# Patient Record
Sex: Female | Born: 1970 | Race: Black or African American | Hispanic: No | Marital: Married | Smoking: Never smoker
Health system: Southern US, Community
[De-identification: ages and names within clinical notes are randomized; demographics above are authoritative.]

## PROBLEM LIST (undated history)

## (undated) DIAGNOSIS — K219 Gastro-esophageal reflux disease without esophagitis: Secondary | ICD-10-CM

## (undated) DIAGNOSIS — D509 Iron deficiency anemia, unspecified: Secondary | ICD-10-CM

## (undated) DIAGNOSIS — E119 Type 2 diabetes mellitus without complications: Secondary | ICD-10-CM

## (undated) DIAGNOSIS — R053 Chronic cough: Secondary | ICD-10-CM

## (undated) DIAGNOSIS — R09A2 Foreign body sensation, throat: Secondary | ICD-10-CM

## (undated) DIAGNOSIS — R0989 Other specified symptoms and signs involving the circulatory and respiratory systems: Secondary | ICD-10-CM

## (undated) HISTORY — PX: HERNIA REPAIR: SHX51

---

## 2012-06-07 ENCOUNTER — Emergency Department (HOSPITAL_BASED_OUTPATIENT_CLINIC_OR_DEPARTMENT_OTHER)
Admission: EM | Admit: 2012-06-07 | Discharge: 2012-06-07 | Discharge status: Against Medical Advice | Payer: Self-pay | Attending: Emergency Medicine | Admitting: Emergency Medicine

## 2012-06-07 ENCOUNTER — Encounter (HOSPITAL_BASED_OUTPATIENT_CLINIC_OR_DEPARTMENT_OTHER): Payer: Self-pay | Admitting: *Deleted

## 2012-06-07 DIAGNOSIS — J029 Acute pharyngitis, unspecified: Secondary | ICD-10-CM | POA: Insufficient documentation

## 2012-06-07 DIAGNOSIS — Z5321 Procedure and treatment not carried out due to patient leaving prior to being seen by health care provider: Secondary | ICD-10-CM

## 2012-06-07 NOTE — ED Notes (Signed)
Geiple, PA to assess pt but pt not on stretcher.

## 2012-06-07 NOTE — ED Notes (Signed)
Pt never returned

## 2012-06-07 NOTE — ED Notes (Signed)
Sore throat x 3 days

## 2017-07-28 ENCOUNTER — Emergency Department (HOSPITAL_BASED_OUTPATIENT_CLINIC_OR_DEPARTMENT_OTHER)
Admission: EM | Admit: 2017-07-28 | Discharge: 2017-07-29 | Disposition: A | Discharge status: Home / Self Care | Payer: BLUE CROSS/BLUE SHIELD | Attending: Emergency Medicine | Admitting: Emergency Medicine

## 2017-07-28 ENCOUNTER — Other Ambulatory Visit: Payer: Self-pay

## 2017-07-28 ENCOUNTER — Emergency Department (HOSPITAL_BASED_OUTPATIENT_CLINIC_OR_DEPARTMENT_OTHER): Payer: BLUE CROSS/BLUE SHIELD

## 2017-07-28 ENCOUNTER — Encounter (HOSPITAL_BASED_OUTPATIENT_CLINIC_OR_DEPARTMENT_OTHER): Payer: Self-pay | Admitting: *Deleted

## 2017-07-28 DIAGNOSIS — I10 Essential (primary) hypertension: Secondary | ICD-10-CM

## 2017-07-28 DIAGNOSIS — R51 Headache: Secondary | ICD-10-CM | POA: Diagnosis present

## 2017-07-28 DIAGNOSIS — Z79899 Other long term (current) drug therapy: Secondary | ICD-10-CM | POA: Insufficient documentation

## 2017-07-28 DIAGNOSIS — G44209 Tension-type headache, unspecified, not intractable: Secondary | ICD-10-CM | POA: Diagnosis not present

## 2017-07-28 MED ORDER — IBUPROFEN 800 MG PO TABS
800.0000 mg | ORAL_TABLET | Freq: Once | ORAL | Status: AC
  Administered 2017-07-28: 800 mg via ORAL
  Filled 2017-07-28: qty 1

## 2017-07-28 MED ORDER — BUTALBITAL-APAP-CAFFEINE 50-325-40 MG PO TABS
1.0000 | ORAL_TABLET | Freq: Once | ORAL | Status: DC
  Filled 2017-07-28: qty 1

## 2017-07-28 MED ORDER — ACETAMINOPHEN 500 MG PO TABS
1000.0000 mg | ORAL_TABLET | Freq: Once | ORAL | Status: AC
  Administered 2017-07-28: 1000 mg via ORAL
  Filled 2017-07-28: qty 2

## 2017-07-28 NOTE — ED Notes (Signed)
Pt given coke for caffeine.

## 2017-07-28 NOTE — ED Notes (Signed)
ED Provider at bedside. 

## 2017-07-28 NOTE — ED Provider Notes (Signed)
[facility] EMERGENCY DEPARTMENT Provider Note   CSN: [id number] Arrival date & time: 07/28/17  1800     History   Chief Complaint Chief Complaint  Patient presents with  . Headache    HPI Kathryn Petersen is a 47 y.o. female.  47 yo F with a chief complaint of a headache.  This been going on for the past couple months.  Usually they are worse at the end of the day.  Pain is at the vertex of her head.  Worse with bright lights.  Nothing else seems to make it better or worse.  Describes it as a throbbing pain.  Denies radiation.  Denies fevers denies neck pain denies trauma.  Patient denies history of headaches prior to this.  Has been taking Advil with significant improvement of her symptoms.  She went to the OB/GYN today and was noted to have a high blood pressure and was sent emergently to the emergency department for a imaging study of her head.  She denies chest pain or shortness of breath.  Denies unilateral numbness or weakness denies difficulty swallowing or difficulty with speech.   The history is provided by the patient.  Headache   This is a new problem. The current episode started more than 1 week ago. The problem occurs constantly. The problem has not changed since onset.The headache is associated with bright light. Pain location: Vertex. The quality of the pain is described as dull and throbbing. The pain is at a severity of 8/10. The pain is moderate. The pain does not radiate. Pertinent negatives include no fever, no palpitations, no shortness of breath, no nausea and no vomiting. She has tried NSAIDs for the symptoms. The treatment provided moderate relief.    History reviewed. No pertinent past medical history.  There are no active problems to display for this patient.   History reviewed. No pertinent surgical history.  OB History    No data available       Home Medications    Prior to Admission medications   Medication Sig Start Date End Date Taking?  Authorizing Provider  acetaminophen (TYLENOL) 325 MG tablet Take 650 mg by mouth every 6 (six) hours as needed.    [provider]  hydrochlorothiazide (HYDRODIURIL) 25 MG tablet Take 1 tablet (25 mg total) by mouth daily. 07/29/17   Melene PlanFloyd, Anaiya Wisinski, DO    Family History No family history on file.  Social History Social History   Tobacco Use  . Smoking status: Never Smoker  . Smokeless tobacco: Never Used  Substance Use Topics  . Alcohol use: Yes    Alcohol/week: 0.0 oz  . Drug use: No     Allergies   Patient has no known allergies.   Review of Systems Review of Systems  Constitutional: Negative for chills and fever.  HENT: Negative for congestion and rhinorrhea.   Eyes: Negative for redness and visual disturbance.  Respiratory: Negative for shortness of breath and wheezing.   Cardiovascular: Negative for chest pain and palpitations.  Gastrointestinal: Negative for nausea and vomiting.  Genitourinary: Negative for dysuria and urgency.  Musculoskeletal: Negative for arthralgias and myalgias.  Skin: Negative for pallor and wound.  Neurological: Positive for headaches. Negative for dizziness.     Physical Exam Updated Vital Signs BP (!) 165/95   Pulse 82   Temp 98.3 F (36.8 C) (Oral)   Resp 20   Ht 5\' 5"  (1.651 m)   Wt 105.2 kg (232 lb)   LMP 06/23/2017  SpO2 99%   BMI 38.61 kg/m   Physical Exam  Constitutional: She is oriented to person, place, and time. She appears well-developed and well-nourished. No distress.  HENT:  Head: Normocephalic and atraumatic.  Eyes: EOM are normal. Pupils are equal, round, and reactive to light.  Neck: Normal range of motion. Neck supple.  Cardiovascular: Normal rate and regular rhythm. Exam reveals no gallop and no friction rub.  No murmur heard. Pulmonary/Chest: Effort normal. She has no wheezes. She has no rales.  Abdominal: Soft. She exhibits no distension. There is no tenderness.  Musculoskeletal: She exhibits no  edema or tenderness.  Neurological: She is alert and oriented to person, place, and time. She has normal strength. No cranial nerve deficit or sensory deficit. She displays a negative Romberg sign. Coordination and gait normal.  Reflex Scores:      Tricep reflexes are 2+ on the right side and 2+ on the left side.      Bicep reflexes are 2+ on the right side and 2+ on the left side.      Brachioradialis reflexes are 2+ on the right side and 2+ on the left side.      Patellar reflexes are 2+ on the right side and 2+ on the left side.      Achilles reflexes are 2+ on the right side and 2+ on the left side. Skin: Skin is warm and dry. She is not diaphoretic.  Psychiatric: She has a normal mood and affect. Her behavior is normal.  Nursing note and vitals reviewed.    ED Treatments / Results  Labs (all labs ordered are listed, but only abnormal results are displayed) Labs Reviewed - No data to display  EKG  EKG Interpretation None       Radiology Ct Head Wo Contrast  Result Date: 07/29/2017 CLINICAL DATA:  Subacute onset of headache. EXAM: CT HEAD WITHOUT CONTRAST TECHNIQUE: Contiguous axial images were obtained from the base of the skull through the vertex without intravenous contrast. COMPARISON:  None. FINDINGS: Brain: No evidence of acute infarction, hemorrhage, hydrocephalus, extra-axial collection or mass lesion/mass effect. The posterior fossa, including the cerebellum, brainstem and fourth ventricle, is within normal limits. The third and lateral ventricles, and basal ganglia are unremarkable in appearance. The cerebral hemispheres are symmetric in appearance, with normal gray-white differentiation. No mass effect or midline shift is seen. Vascular: No hyperdense vessel or unexpected calcification. Skull: There is no evidence of fracture; visualized osseous structures are unremarkable in appearance. Sinuses/Orbits: The visualized portions of the orbits are within normal limits. The  paranasal sinuses and mastoid air cells are well-aerated. Other: No significant soft tissue abnormalities are seen. IMPRESSION: Unremarkable noncontrast CT of the head. Electronically Signed   By: Roanna Raider M.D.   On: 07/29/2017 00:21    Procedures Procedures (including critical care time)  Medications Ordered in ED Medications  butalbital-acetaminophen-caffeine (FIORICET, ESGIC) 50-325-40 MG per tablet 1 tablet (1 tablet Oral Not Given 07/28/17 2323)  ibuprofen (ADVIL,MOTRIN) tablet 800 mg (800 mg Oral Given 07/28/17 2323)  acetaminophen (TYLENOL) tablet 1,000 mg (1,000 mg Oral Given 07/28/17 2337)     Initial Impression / Assessment and Plan / ED Course  I have reviewed the triage vital signs and the nursing notes.  Pertinent labs & imaging results that were available during my care of the patient were reviewed by me and considered in my medical decision making (see chart for details).     47 yo F with a chief complaint  of a headache.  Sounds like a tension headache by history.  She was sent from another clinic for a CT of the head.  I discussed the limitations of that study with the patient's but she would like one performed today.  CT negative.  Discharge home.  12:26 AM:  I have discussed the diagnosis/risks/treatment options with the patient and believe the pt to be eligible for discharge home to follow-up with PCP, Neuro. We also discussed returning to the ED immediately if new or worsening sx occur. We discussed the sx which are most concerning (e.g., sudden worsening pain, fever, inability to tolerate by mouth) that necessitate immediate return. Medications administered to the patient during their visit and any new prescriptions provided to the patient are listed below.  Medications given during this visit Medications  butalbital-acetaminophen-caffeine (FIORICET, ESGIC) 50-325-40 MG per tablet 1 tablet (1 tablet Oral Not Given 07/28/17 2323)  ibuprofen (ADVIL,MOTRIN) tablet  800 mg (800 mg Oral Given 07/28/17 2323)  acetaminophen (TYLENOL) tablet 1,000 mg (1,000 mg Oral Given 07/28/17 2337)     The patient appears reasonably screen and/or stabilized for discharge and I doubt any other medical condition or other [facility] requiring further screening, evaluation, or treatment in the ED at this time prior to discharge.    Final Clinical Impressions(s) / ED Diagnoses   Final diagnoses:  Tension headache  Essential hypertension    ED Discharge Orders        Ordered    Ambulatory referral to Neurology    Comments:  New headache syndrome   07/29/17 0024    hydrochlorothiazide (HYDRODIURIL) 25 MG tablet  Daily     07/29/17 0026       Melene Plan, DO 07/29/17 [id number]

## 2017-07-28 NOTE — ED Triage Notes (Signed)
Headache for a week. She went for her routine GYN exam and was told her BP is elevated.

## 2017-07-29 MED ORDER — HYDROCHLOROTHIAZIDE 25 MG PO TABS: 25.0000 mg | ORAL_TABLET | Freq: Every day | ORAL | 0 refills | Status: AC

## 2017-07-29 NOTE — Discharge Instructions (Signed)
Take 4 over the counter ibuprofen tablets 3 times a day or 2 over-the-counter naproxen tablets twice a day for pain. Also take tylenol 1000mg (2 extra strength) four times a day.    Your image of your head was normal.  Please follow-up with neurology to further evaluate your new headaches. Please follow up with a primary care provider to be evaluated and treated for your high blood pressure.

## 2020-07-04 DIAGNOSIS — L700 Acne vulgaris: Secondary | ICD-10-CM | POA: Diagnosis not present

## 2020-12-22 DIAGNOSIS — D509 Iron deficiency anemia, unspecified: Secondary | ICD-10-CM | POA: Diagnosis not present

## 2020-12-22 DIAGNOSIS — M722 Plantar fascial fibromatosis: Secondary | ICD-10-CM | POA: Diagnosis not present

## 2020-12-22 DIAGNOSIS — R053 Chronic cough: Secondary | ICD-10-CM | POA: Diagnosis not present

## 2021-01-03 DIAGNOSIS — Z1231 Encounter for screening mammogram for malignant neoplasm of breast: Secondary | ICD-10-CM | POA: Diagnosis not present

## 2021-03-15 DIAGNOSIS — Z0001 Encounter for general adult medical examination with abnormal findings: Secondary | ICD-10-CM | POA: Diagnosis not present

## 2021-03-15 DIAGNOSIS — Z23 Encounter for immunization: Secondary | ICD-10-CM | POA: Diagnosis not present

## 2021-03-15 DIAGNOSIS — D509 Iron deficiency anemia, unspecified: Secondary | ICD-10-CM | POA: Diagnosis not present

## 2021-03-15 DIAGNOSIS — K5909 Other constipation: Secondary | ICD-10-CM | POA: Diagnosis not present

## 2021-03-15 DIAGNOSIS — Z13220 Encounter for screening for lipoid disorders: Secondary | ICD-10-CM | POA: Diagnosis not present

## 2021-03-15 DIAGNOSIS — Z131 Encounter for screening for diabetes mellitus: Secondary | ICD-10-CM | POA: Diagnosis not present

## 2021-03-15 DIAGNOSIS — Z0000 Encounter for general adult medical examination without abnormal findings: Secondary | ICD-10-CM | POA: Diagnosis not present

## 2021-03-15 DIAGNOSIS — Z1329 Encounter for screening for other suspected endocrine disorder: Secondary | ICD-10-CM | POA: Diagnosis not present

## 2021-03-15 DIAGNOSIS — R053 Chronic cough: Secondary | ICD-10-CM | POA: Diagnosis not present

## 2021-03-30 DIAGNOSIS — Z1211 Encounter for screening for malignant neoplasm of colon: Secondary | ICD-10-CM | POA: Diagnosis not present

## 2021-03-30 DIAGNOSIS — K219 Gastro-esophageal reflux disease without esophagitis: Secondary | ICD-10-CM | POA: Diagnosis not present

## 2021-03-30 DIAGNOSIS — R1310 Dysphagia, unspecified: Secondary | ICD-10-CM | POA: Diagnosis not present

## 2021-05-01 DIAGNOSIS — Z1211 Encounter for screening for malignant neoplasm of colon: Secondary | ICD-10-CM | POA: Diagnosis not present

## 2021-05-01 DIAGNOSIS — R1310 Dysphagia, unspecified: Secondary | ICD-10-CM | POA: Diagnosis not present

## 2021-05-01 DIAGNOSIS — K648 Other hemorrhoids: Secondary | ICD-10-CM | POA: Diagnosis not present

## 2021-05-01 DIAGNOSIS — K219 Gastro-esophageal reflux disease without esophagitis: Secondary | ICD-10-CM | POA: Diagnosis not present

## 2021-05-01 DIAGNOSIS — R0989 Other specified symptoms and signs involving the circulatory and respiratory systems: Secondary | ICD-10-CM | POA: Diagnosis not present

## 2021-05-26 ENCOUNTER — Other Ambulatory Visit: Payer: Self-pay

## 2021-05-26 ENCOUNTER — Emergency Department (HOSPITAL_BASED_OUTPATIENT_CLINIC_OR_DEPARTMENT_OTHER): Payer: BC Managed Care – PPO

## 2021-05-26 ENCOUNTER — Emergency Department (HOSPITAL_BASED_OUTPATIENT_CLINIC_OR_DEPARTMENT_OTHER)
Admission: EM | Admit: 2021-05-26 | Discharge: 2021-05-26 | Disposition: A | Discharge status: Home / Self Care | Payer: BC Managed Care – PPO | Attending: Emergency Medicine | Admitting: Emergency Medicine

## 2021-05-26 ENCOUNTER — Encounter (HOSPITAL_BASED_OUTPATIENT_CLINIC_OR_DEPARTMENT_OTHER): Payer: Self-pay | Admitting: Emergency Medicine

## 2021-05-26 DIAGNOSIS — J209 Acute bronchitis, unspecified: Secondary | ICD-10-CM

## 2021-05-26 DIAGNOSIS — R0602 Shortness of breath: Secondary | ICD-10-CM | POA: Diagnosis not present

## 2021-05-26 DIAGNOSIS — Z20822 Contact with and (suspected) exposure to covid-19: Secondary | ICD-10-CM | POA: Diagnosis not present

## 2021-05-26 DIAGNOSIS — J9801 Acute bronchospasm: Secondary | ICD-10-CM | POA: Diagnosis not present

## 2021-05-26 HISTORY — DX: Other specified symptoms and signs involving the circulatory and respiratory systems: R09.89

## 2021-05-26 HISTORY — DX: Chronic cough: R05.3

## 2021-05-26 HISTORY — DX: Gastro-esophageal reflux disease without esophagitis: K21.9

## 2021-05-26 HISTORY — DX: Iron deficiency anemia, unspecified: D50.9

## 2021-05-26 HISTORY — DX: Foreign body sensation, throat: R09.A2

## 2021-05-26 LAB — RESP PANEL BY RT-PCR (FLU A&B, COVID) ARPGX2
Influenza A by PCR: NEGATIVE
Influenza B by PCR: NEGATIVE
SARS Coronavirus 2 by RT PCR: NEGATIVE

## 2021-05-26 MED ORDER — DEXAMETHASONE SODIUM PHOSPHATE 10 MG/ML IJ SOLN
10.0000 mg | Freq: Once | INTRAMUSCULAR | Status: AC
  Administered 2021-05-26: 10 mg via INTRAMUSCULAR
  Filled 2021-05-26: qty 1

## 2021-05-26 MED ORDER — ALBUTEROL SULFATE HFA 108 (90 BASE) MCG/ACT IN AERS
INHALATION_SPRAY | RESPIRATORY_TRACT | Status: AC
  Filled 2021-05-26: qty 6.7

## 2021-05-26 MED ORDER — IPRATROPIUM BROMIDE HFA 17 MCG/ACT IN AERS
2.0000 | INHALATION_SPRAY | Freq: Once | RESPIRATORY_TRACT | Status: AC
  Administered 2021-05-26: 2 via RESPIRATORY_TRACT
  Filled 2021-05-26: qty 12.9

## 2021-05-26 MED ORDER — ALBUTEROL SULFATE HFA 108 (90 BASE) MCG/ACT IN AERS
8.0000 | INHALATION_SPRAY | Freq: Once | RESPIRATORY_TRACT | Status: AC
Start: 2021-05-26 — End: 2021-05-26
  Administered 2021-05-26: 8 via RESPIRATORY_TRACT

## 2021-05-26 MED ORDER — IPRATROPIUM-ALBUTEROL 0.5-2.5 (3) MG/3ML IN SOLN
3.0000 mL | RESPIRATORY_TRACT | Status: DC
  Administered 2021-05-26: 3 mL via RESPIRATORY_TRACT
  Filled 2021-05-26: qty 3

## 2021-05-26 MED ORDER — ALBUTEROL SULFATE (2.5 MG/3ML) 0.083% IN NEBU
2.5000 mg | INHALATION_SOLUTION | Freq: Once | RESPIRATORY_TRACT | Status: AC
Start: 2021-05-26 — End: 2021-05-26
  Administered 2021-05-26: 2.5 mg via RESPIRATORY_TRACT
  Filled 2021-05-26: qty 3

## 2021-05-26 NOTE — ED Provider Notes (Signed)
[facility] Provider Note: Lowella Dell, MD, FACEP  CSN: [id number] MRN: [id number] ARRIVAL: 05/26/21 at 0135 ROOM: [facility]12/[facility]12   CHIEF COMPLAINT  Shortness of Breath   HISTORY OF PRESENT ILLNESS  05/26/21 4:04 AM Kathryn Petersen is a 51 y.o. female with shortness of breath since about 3 AM yesterday morning.  She has had associated wheezing and cough.  She has no history of asthma.  She is not febrile.  She was given multiple albuterol and ipratropium inhaler treatments prior to my evaluation with improvement in her dyspnea but wheezing continues.  She states she has chronic lower extremity edema.  She denies vomiting or diarrhea.   Past Medical History:  Diagnosis Date   Chronic cough    GERD (gastroesophageal reflux disease)    Globus sensation    Iron deficiency anemia     History reviewed. No pertinent surgical history.  History reviewed. No pertinent family history.  Social History   Tobacco Use   Smoking status: Never   Smokeless tobacco: Never  Substance Use Topics   Alcohol use: Yes    Alcohol/week: 0.0 standard drinks   Drug use: No    Prior to Admission medications   Medication Sig Start Date End Date Taking? Authorizing Provider  Benzoyl Peroxide 5.3 % FOAM Apply topically. 02/11/20  Yes [provider]  famotidine (PEPCID) 40 MG tablet TAKE 1 TABLET BY MOUTH EVERY DAY AT NIGHT 08/27/19  Yes [provider]  ferrous sulfate 325 (65 FE) MG EC tablet Take by mouth. 12/25/20  Yes [provider]  fluticasone (FLONASE) 50 MCG/ACT nasal spray Place into the nose. 09/30/17  Yes [provider]  gabapentin (NEURONTIN) 300 MG capsule Take by mouth. 08/02/19  Yes [provider]  acetaminophen (TYLENOL) 325 MG tablet Take 650 mg by mouth every 6 (six) hours as needed.    [provider]  albuterol (VENTOLIN HFA) 108 (90 Base) MCG/ACT inhaler Inhale 2 puffs into the lungs every 4 (four) hours. 03/15/21    [provider]  cetirizine (ZYRTEC) 10 MG tablet Take 10 mg by mouth daily. 03/15/21   [provider]  hydrochlorothiazide (HYDRODIURIL) 25 MG tablet Take 1 tablet (25 mg total) by mouth daily. 07/29/17   Melene Plan, DO  meloxicam (MOBIC) 15 MG tablet Take 15 mg by mouth daily. 12/22/20   [provider]  pantoprazole (PROTONIX) 40 MG tablet Take 40 mg by mouth every morning. 03/15/21   [provider]    Allergies Patient has no known allergies.   REVIEW OF SYSTEMS  Negative except as noted here or in the History of Present Illness.   PHYSICAL EXAMINATION  Initial Vital Signs Blood pressure (!) 159/105, pulse (!) 103, temperature 97.9 F (36.6 C), temperature source Oral, resp. rate (!) 28, height 5\' 5"  (1.651 m), weight 105.2 kg, SpO2 94 %.  Examination General: Well-developed, well-nourished female in no acute distress; appearance consistent with age of record HENT: normocephalic; atraumatic Eyes: Normal appearance Neck: supple Heart: regular rate and rhythm Lungs: clear to auscultation bilaterally Abdomen: soft; nondistended; nontender; bowel sounds present Extremities: No deformity; full range of motion; edema of lower legs Neurologic: Awake, alert and oriented; motor function intact in all extremities and symmetric; no facial droop Skin: Warm and dry Psychiatric: Normal mood and affect   RESULTS  Summary of this visit's results, reviewed and interpreted by myself:   EKG Interpretation  Date/Time:    Ventricular Rate:    PR Interval:  QRS Duration:   QT Interval:    QTC Calculation:   R Axis:     Text Interpretation:         Laboratory Studies: Results for orders placed or performed during the hospital encounter of 05/26/21 (from the past 24 hour(s))  Resp Panel by RT-PCR (Flu A&B, Covid) Nasopharyngeal Swab     Status: None   Collection Time: 05/26/21  1:46 AM   Specimen: Nasopharyngeal Swab; Nasopharyngeal(NP) swabs in  vial transport medium  Result Value Ref Range   SARS Coronavirus 2 by RT PCR NEGATIVE NEGATIVE   Influenza A by PCR NEGATIVE NEGATIVE   Influenza B by PCR NEGATIVE NEGATIVE   Imaging Studies: DG Chest 2 View  Result Date: 05/26/2021 CLINICAL DATA:  Shortness of breath. EXAM: CHEST - 2 VIEW COMPARISON:  Chest radiograph dated 08/18/2017 FINDINGS: The heart size and mediastinal contours are within normal limits. Both lungs are clear. The visualized skeletal structures are unremarkable. IMPRESSION: No active cardiopulmonary disease. Electronically Signed   By: Elgie Collard M.D.   On: 05/26/2021 02:32    ED COURSE and MDM  Nursing notes, initial and subsequent vitals signs, including pulse oximetry, reviewed and interpreted by myself.  Vitals:   05/26/21 0230 05/26/21 0300 05/26/21 0419 05/26/21 0500  BP:  (!) 144/105  133/71  Pulse: 96   (!) 103  Resp:    20  Temp:      TempSrc:      SpO2: 94%  95% 94%  Weight:      Height:       Medications  albuterol (VENTOLIN HFA) 108 (90 Base) MCG/ACT inhaler (  Not Given 05/26/21 0211)  ipratropium-albuterol (DUONEB) 0.5-2.5 (3) MG/3ML nebulizer solution 3 mL (3 mLs Nebulization Given 05/26/21 0415)  albuterol (VENTOLIN HFA) 108 (90 Base) MCG/ACT inhaler 8 puff (8 puffs Inhalation Given 05/26/21 0153)  ipratropium (ATROVENT HFA) inhaler 2 puff (2 puffs Inhalation Given 05/26/21 0209)  albuterol (PROVENTIL) (2.5 MG/3ML) 0.083% nebulizer solution 2.5 mg (2.5 mg Nebulization Given 05/26/21 0415)  dexamethasone (DECADRON) injection 10 mg (10 mg Intramuscular Given 05/26/21 0411)   5:22 AM Lungs clear after additional neb treatment.  Patient clearly has acute bronchospasm.  She is negative for COVID and influenza but this could represent an acute viral illness.  It could also be reactive to an environmental stressor.  She was given a dose of dexamethasone and we will send her home with an inhaler and AeroChamber.   PROCEDURES  Procedures   ED DIAGNOSES      ICD-10-CM   1. Acute bronchitis with bronchospasm  J20.9          Aidenn Skellenger, Jonny Ruiz, MD 05/26/21 [phone]

## 2021-05-26 NOTE — Progress Notes (Signed)
Patient states she is feeling better after MDI treatments.

## 2021-05-26 NOTE — ED Triage Notes (Addendum)
Pt c/o shob since 3 am. Pt also reports cough. Pt has audible wheezing.

## 2021-05-26 NOTE — ED Notes (Signed)
Patient transported to X-ray 

## 2021-06-02 ENCOUNTER — Other Ambulatory Visit: Payer: Self-pay

## 2021-06-02 ENCOUNTER — Emergency Department (HOSPITAL_BASED_OUTPATIENT_CLINIC_OR_DEPARTMENT_OTHER): Payer: BC Managed Care – PPO

## 2021-06-02 ENCOUNTER — Encounter (HOSPITAL_BASED_OUTPATIENT_CLINIC_OR_DEPARTMENT_OTHER): Payer: Self-pay | Admitting: Emergency Medicine

## 2021-06-02 ENCOUNTER — Emergency Department (HOSPITAL_BASED_OUTPATIENT_CLINIC_OR_DEPARTMENT_OTHER)
Admission: EM | Admit: 2021-06-02 | Discharge: 2021-06-02 | Disposition: A | Discharge status: Home / Self Care | Payer: BC Managed Care – PPO | Attending: Emergency Medicine | Admitting: Emergency Medicine

## 2021-06-02 DIAGNOSIS — Z7951 Long term (current) use of inhaled steroids: Secondary | ICD-10-CM | POA: Diagnosis not present

## 2021-06-02 DIAGNOSIS — J209 Acute bronchitis, unspecified: Secondary | ICD-10-CM | POA: Insufficient documentation

## 2021-06-02 DIAGNOSIS — J40 Bronchitis, not specified as acute or chronic: Secondary | ICD-10-CM

## 2021-06-02 DIAGNOSIS — M7989 Other specified soft tissue disorders: Secondary | ICD-10-CM | POA: Insufficient documentation

## 2021-06-02 DIAGNOSIS — J4541 Moderate persistent asthma with (acute) exacerbation: Secondary | ICD-10-CM | POA: Diagnosis not present

## 2021-06-02 DIAGNOSIS — R0602 Shortness of breath: Secondary | ICD-10-CM | POA: Diagnosis not present

## 2021-06-02 DIAGNOSIS — R7989 Other specified abnormal findings of blood chemistry: Secondary | ICD-10-CM | POA: Diagnosis not present

## 2021-06-02 DIAGNOSIS — M549 Dorsalgia, unspecified: Secondary | ICD-10-CM | POA: Diagnosis not present

## 2021-06-02 DIAGNOSIS — Z0389 Encounter for observation for other suspected diseases and conditions ruled out: Secondary | ICD-10-CM | POA: Diagnosis not present

## 2021-06-02 HISTORY — DX: Type 2 diabetes mellitus without complications: E11.9

## 2021-06-02 LAB — CBC WITH DIFFERENTIAL/PLATELET
Abs Immature Granulocytes: 0.01 10*3/uL (ref 0.00–0.07)
Basophils Absolute: 0 10*3/uL (ref 0.0–0.1)
Basophils Relative: 1 %
Eosinophils Absolute: 0.8 10*3/uL — ABNORMAL HIGH (ref 0.0–0.5)
Eosinophils Relative: 14 %
HCT: 37.1 % (ref 36.0–46.0)
Hemoglobin: 11.4 g/dL — ABNORMAL LOW (ref 12.0–15.0)
Immature Granulocytes: 0 %
Lymphocytes Relative: 27 %
Lymphs Abs: 1.6 10*3/uL (ref 0.7–4.0)
MCH: 22.2 pg — ABNORMAL LOW (ref 26.0–34.0)
MCHC: 30.7 g/dL (ref 30.0–36.0)
MCV: 72.3 fL — ABNORMAL LOW (ref 80.0–100.0)
Monocytes Absolute: 0.4 10*3/uL (ref 0.1–1.0)
Monocytes Relative: 7 %
Neutro Abs: 3.1 10*3/uL (ref 1.7–7.7)
Neutrophils Relative %: 51 %
Platelets: 271 10*3/uL (ref 150–400)
RBC: 5.13 MIL/uL — ABNORMAL HIGH (ref 3.87–5.11)
RDW: 15.2 % (ref 11.5–15.5)
WBC: 6 10*3/uL (ref 4.0–10.5)
nRBC: 0 % (ref 0.0–0.2)

## 2021-06-02 LAB — BASIC METABOLIC PANEL
Anion gap: 7 (ref 5–15)
BUN: 9 mg/dL (ref 6–20)
CO2: 28 mmol/L (ref 22–32)
Calcium: 8.7 mg/dL — ABNORMAL LOW (ref 8.9–10.3)
Chloride: 102 mmol/L (ref 98–111)
Creatinine, Ser: 0.76 mg/dL (ref 0.44–1.00)
GFR, Estimated: >60 mL/min (ref >60)
Glucose, Bld: 113 mg/dL — ABNORMAL HIGH (ref 70–99)
Potassium: 3.7 mmol/L (ref 3.5–5.1)
Sodium: 137 mmol/L (ref 135–145)

## 2021-06-02 LAB — D-DIMER, QUANTITATIVE: D-Dimer, Quant: 0.69 ug/mL-FEU — ABNORMAL HIGH (ref 0.00–0.50)

## 2021-06-02 MED ORDER — SODIUM CHLORIDE 0.9 % IV BOLUS
500.0000 mL | Freq: Once | INTRAVENOUS | Status: AC
  Administered 2021-06-02: 500 mL via INTRAVENOUS

## 2021-06-02 MED ORDER — RACEPINEPHRINE HCL 2.25 % IN NEBU
0.5000 mL | INHALATION_SOLUTION | Freq: Once | RESPIRATORY_TRACT | Status: AC
  Administered 2021-06-02: 0.5 mL via RESPIRATORY_TRACT
  Filled 2021-06-02: qty 0.5

## 2021-06-02 MED ORDER — IOHEXOL 300 MG/ML  SOLN
60.0000 mL | Freq: Once | INTRAMUSCULAR | Status: AC | PRN
  Administered 2021-06-02: 60 mL via INTRAVENOUS

## 2021-06-02 MED ORDER — ALBUTEROL SULFATE (2.5 MG/3ML) 0.083% IN NEBU
5.0000 mg | INHALATION_SOLUTION | Freq: Once | RESPIRATORY_TRACT | Status: AC
  Administered 2021-06-02: 5 mg via RESPIRATORY_TRACT
  Filled 2021-06-02: qty 6

## 2021-06-02 MED ORDER — IPRATROPIUM-ALBUTEROL 0.5-2.5 (3) MG/3ML IN SOLN
3.0000 mL | Freq: Once | RESPIRATORY_TRACT | Status: AC
  Administered 2021-06-02: 3 mL via RESPIRATORY_TRACT
  Filled 2021-06-02: qty 3

## 2021-06-02 MED ORDER — IBUPROFEN 400 MG PO TABS
400.0000 mg | ORAL_TABLET | Freq: Once | ORAL | Status: AC
  Administered 2021-06-02: 400 mg via ORAL
  Filled 2021-06-02: qty 1

## 2021-06-02 MED ORDER — DIPHENHYDRAMINE HCL 50 MG/ML IJ SOLN
50.0000 mg | Freq: Once | INTRAMUSCULAR | Status: AC
  Administered 2021-06-02: 50 mg via INTRAVENOUS
  Filled 2021-06-02: qty 1

## 2021-06-02 MED ORDER — DEXAMETHASONE SODIUM PHOSPHATE 10 MG/ML IJ SOLN
10.0000 mg | Freq: Once | INTRAMUSCULAR | Status: AC
  Administered 2021-06-02: 10 mg via INTRAMUSCULAR
  Filled 2021-06-02: qty 1

## 2021-06-02 MED ORDER — IOHEXOL 350 MG/ML SOLN
100.0000 mL | Freq: Once | INTRAVENOUS | Status: AC | PRN
  Administered 2021-06-02: 100 mL via INTRAVENOUS

## 2021-06-02 NOTE — ED Notes (Signed)
RT at bedside.

## 2021-06-02 NOTE — ED Triage Notes (Signed)
Pt is c/o shortness of breath  Pt was seen on the 7th for same  Pt states she has been using her inhaler at home without relief

## 2021-06-02 NOTE — ED Notes (Addendum)
Patient returned from CT

## 2021-06-02 NOTE — ED Notes (Signed)
Patient transported to US 

## 2021-06-02 NOTE — ED Notes (Signed)
RT at bedside for breathing tx.

## 2021-06-02 NOTE — ED Notes (Signed)
Pt returned from [place], c/o of shob, using accessory muscles to breath. RT notified.

## 2021-06-02 NOTE — ED Notes (Signed)
IV access attempted x 1, unsuccessful. RT at bedside, will attempt.

## 2021-06-02 NOTE — Discharge Instructions (Signed)
Follow-up with your primary care doctor on Thursday as scheduled.  Use the albuterol inhaler every 4-6 hours for the next 3 to 4 days and then just use it as needed after that.  Return to emergency room if you have any worsening symptoms.

## 2021-06-02 NOTE — ED Provider Notes (Signed)
[facility] EMERGENCY DEPARTMENT Provider Note   CSN: [id number] Arrival date & time: 06/02/21  [id number]     History  Chief Complaint  Patient presents with   Shortness of Breath    Kathryn Petersen is a 51 y.o. female.  Patient is a 51 year old female who presents with cough wheezing and shortness of breath.  She has had about a 1 week history of cough and congestion.  She reports a cough that is productive of yellow sputum.  Has a bifrontal headache.  She feels febrile at times.  She has some myalgias.  She also has had some wheezing.  No known history of asthma or COPD.  She was seen here on January 7 for similar symptoms.  She was given dose of Decadron and inhalers.  She was discharged with inhalers.  She has been using them but her symptoms got worse since yesterday.  She has some pain in her back which she says is mostly from coughing.  She does not report any discomfort when she is not coughing.  She does notice that her left leg has started swelling as compared to her right.  She says this is different than her baseline swelling.  She is not a smoker.      Home Medications Prior to Admission medications   Medication Sig Start Date End Date Taking? Authorizing Provider  acetaminophen (TYLENOL) 325 MG tablet Take 650 mg by mouth every 6 (six) hours as needed.    [provider]  albuterol (VENTOLIN HFA) 108 (90 Base) MCG/ACT inhaler Inhale 2 puffs into the lungs every 4 (four) hours. 03/15/21   [provider]  Benzoyl Peroxide 5.3 % FOAM Apply topically. 02/11/20   [provider]  cetirizine (ZYRTEC) 10 MG tablet Take 10 mg by mouth daily. 03/15/21   [provider]  famotidine (PEPCID) 40 MG tablet TAKE 1 TABLET BY MOUTH EVERY DAY AT NIGHT 08/27/19   [provider]  ferrous sulfate 325 (65 FE) MG EC tablet Take by mouth. 12/25/20   [provider]  fluticasone (FLONASE) 50 MCG/ACT nasal spray Place into the nose. 09/30/17    [provider]  gabapentin (NEURONTIN) 300 MG capsule Take by mouth. 08/02/19   [provider]  hydrochlorothiazide (HYDRODIURIL) 25 MG tablet Take 1 tablet (25 mg total) by mouth daily. 07/29/17   Deno Etienne, DO  meloxicam (MOBIC) 15 MG tablet Take 15 mg by mouth daily. 12/22/20   [provider]  pantoprazole (PROTONIX) 40 MG tablet Take 40 mg by mouth every morning. 03/15/21   [provider]      Allergies    Patient has no known allergies.    Review of Systems   Review of Systems  Constitutional:  Positive for fatigue and fever. Negative for chills and diaphoresis.  HENT:  Negative for congestion, rhinorrhea and sneezing.   Eyes: Negative.   Respiratory:  Positive for cough, shortness of breath and wheezing. Negative for chest tightness.   Cardiovascular:  Negative for chest pain and leg swelling.  Gastrointestinal:  Negative for abdominal pain, blood in stool, diarrhea, nausea and vomiting.  Genitourinary:  Negative for difficulty urinating, flank pain, frequency and hematuria.  Musculoskeletal:  Positive for back pain. Negative for arthralgias.  Skin:  Negative for rash.  Neurological:  Positive for headaches. Negative for dizziness, speech difficulty, weakness and numbness.   Physical Exam Updated Vital Signs BP (!) 152/85    Pulse 96    Temp 98 F (  36.7 C) (Oral)    Resp 18    Ht 5\' 5"  (1.651 m)    Wt 106.8 kg    SpO2 96%    BMI 39.17 kg/m  Physical Exam Constitutional:      Appearance: She is well-developed.  HENT:     Head: Normocephalic and atraumatic.  Eyes:     Pupils: Pupils are equal, round, and reactive to light.  Cardiovascular:     Rate and Rhythm: Normal rate and regular rhythm.     Heart sounds: Normal heart sounds.  Pulmonary:     Effort: Pulmonary effort is normal. No respiratory distress.     Breath sounds: Wheezing present. No rales.     Comments: Mild expiratory wheezing in the upper lung fields bilaterally, mild  tachypnea, no increased work of breathing Chest:     Chest wall: No tenderness.  Abdominal:     General: Bowel sounds are normal.     Palpations: Abdomen is soft.     Tenderness: There is no abdominal tenderness. There is no guarding or rebound.  Musculoskeletal:        General: Normal range of motion.     Cervical back: Normal range of motion and neck supple.     Comments: Trace edema to lower extremities bilaterally, the left is slightly greater than the right.  Lymphadenopathy:     Cervical: No cervical adenopathy.  Skin:    General: Skin is warm and dry.     Findings: No rash.  Neurological:     Mental Status: She is alert and oriented to person, place, and time.    ED Results / Procedures / Treatments   Labs (all labs ordered are listed, but only abnormal results are displayed) Labs Reviewed  BASIC METABOLIC PANEL - Abnormal; Notable for the following components:      Result Value   Glucose, Bld 113 (*)    Calcium 8.7 (*)    All other components within normal limits  CBC WITH DIFFERENTIAL/PLATELET - Abnormal; Notable for the following components:   RBC 5.13 (*)    Hemoglobin 11.4 (*)    MCV 72.3 (*)    MCH 22.2 (*)    Eosinophils Absolute 0.8 (*)    All other components within normal limits  D-DIMER, QUANTITATIVE - Abnormal; Notable for the following components:   D-Dimer, Quant 0.69 (*)    All other components within normal limits    EKG None  Radiology DG Chest 2 View  Result Date: 06/02/2021 CLINICAL DATA:  Shortness of breath EXAM: CHEST - 2 VIEW COMPARISON:  05/26/2021 FINDINGS: Normal heart size and mediastinal contours. Central airway cuffing also seen on chest CT from 2021. No acute infiltrate or edema. No effusion or pneumothorax. No acute osseous findings. IMPRESSION: Airway thickening without collapse or consolidation. Electronically Signed   By: Jorje Guild M.D.   On: 06/02/2021 08:26   CT Soft Tissue Neck W Contrast  Result Date:  06/02/2021 CLINICAL DATA:  Epiglottitis or tonsillitis suspected EXAM: CT NECK WITH CONTRAST TECHNIQUE: Multidetector CT imaging of the neck was performed using the standard protocol following the bolus administration of intravenous contrast. RADIATION DOSE REDUCTION: This exam was performed according to the departmental dose-optimization program which includes automated exposure control, adjustment of the mA and/or kV according to patient size and/or use of iterative reconstruction technique. CONTRAST:  78mL OMNIPAQUE IOHEXOL 300 MG/ML  SOLN COMPARISON:  None. FINDINGS: Pharynx and larynx: Normal. No mass or swelling. Salivary glands: No inflammation, mass,  or stone. Thyroid: Normal. Lymph nodes: None enlarged or abnormal density. Vascular: Negative. Limited intracranial: Negative. Visualized orbits: Negative. Mastoids and visualized paranasal sinuses: Clear. Skeleton: No acute or aggressive process. Upper chest: Negative. IMPRESSION: No acute finding or airway narrowing. Electronically Signed   By: Jorje Guild M.D.   On: 06/02/2021 12:23   CT Angio Chest PE W/Cm &/Or Wo Cm  Result Date: 06/02/2021 CLINICAL DATA:  51 year old female with positive D-dimer. Clinical suspicion for pulmonary embolism. EXAM: CT ANGIOGRAPHY CHEST WITH CONTRAST TECHNIQUE: Multidetector CT imaging of the chest was performed using the standard protocol during bolus administration of intravenous contrast. Multiplanar CT image reconstructions and MIPs were obtained to evaluate the vascular anatomy. RADIATION DOSE REDUCTION: This exam was performed according to the departmental dose-optimization program which includes automated exposure control, adjustment of the mA and/or kV according to patient size and/or use of iterative reconstruction technique. CONTRAST:  139mL OMNIPAQUE IOHEXOL 350 MG/ML SOLN COMPARISON:  Contrast enhanced chest CT 08/10/2019. FINDINGS: Cardiovascular: There are no filling defects within the pulmonary arterial  tree to suggest pulmonary embolism. Heart size is normal. There is no significant pericardial fluid, thickening or pericardial calcification. No atherosclerotic calcifications are noted in the thoracic aorta or the coronary arteries. Mediastinum/Nodes: No pathologically enlarged mediastinal or hilar lymph nodes. Esophagus is unremarkable in appearance. No axillary lymphadenopathy. Lungs/Pleura: No acute consolidative airspace disease. No pleural effusions. No suspicious appearing pulmonary nodules or masses are noted. Diffuse bronchial wall thickening is identified. No peribronchovascular inflammatory changes. Upper Abdomen: Unremarkable. Musculoskeletal: There are no aggressive appearing lytic or blastic lesions noted in the visualized portions of the skeleton. Review of the MIP images confirms the above findings. IMPRESSION: 1. No evidence of pulmonary embolism. 2. Diffuse bronchial wall thickening, suggestive of severe reactive airway disease. Electronically Signed   By: Vinnie Langton M.D.   On: 06/02/2021 10:40   US Venous Img Lower Unilateral Left (DVT)  Result Date: 06/02/2021 CLINICAL DATA:  Leg swelling EXAM: LEFT LOWER EXTREMITY VENOUS DOPPLER ULTRASOUND TECHNIQUE: Gray-scale sonography with compression, as well as color and duplex ultrasound, were performed to evaluate the deep venous system(s) from the level of the common femoral vein through the popliteal and proximal calf veins. COMPARISON:  Chest XR, concurrent. FINDINGS: VENOUS Normal compressibility of the LEFT common femoral, superficial femoral, and popliteal veins, as well as the visualized calf veins. Visualized portions of profunda femoral vein and great saphenous vein unremarkable. No filling defects to suggest DVT on grayscale or color Doppler imaging. Doppler waveforms show normal direction of venous flow, normal respiratory plasticity and response to augmentation. Limited views of the contralateral common femoral vein are  unremarkable. OTHER No evidence of superficial thrombophlebitis or abnormal fluid collection. Limitations: none IMPRESSION: No evidence of femoropopliteal DVT within the LEFT lower extremity. Michaelle Birks, MD [facility] Radiology Electronically Signed   By: Michaelle Birks M.D.   On: 06/02/2021 09:40    Procedures Procedures    Medications Ordered in ED Medications  ipratropium-albuterol (DUONEB) 0.5-2.5 (3) MG/3ML nebulizer solution 3 mL (3 mLs Nebulization Given 06/02/21 0656)  albuterol (PROVENTIL) (2.5 MG/3ML) 0.083% nebulizer solution 5 mg (5 mg Nebulization Given 06/02/21 0750)  ibuprofen (ADVIL) tablet 400 mg (400 mg Oral Given 06/02/21 0742)  dexamethasone (DECADRON) injection 10 mg (10 mg Intramuscular Given 06/02/21 0934)  albuterol (PROVENTIL) (2.5 MG/3ML) 0.083% nebulizer solution 5 mg (5 mg Nebulization Given 06/02/21 0945)  iohexol (OMNIPAQUE) 350 MG/ML injection 100 mL (100 mLs Intravenous Contrast Given  06/02/21 1022)  sodium chloride 0.9 % bolus 500 mL (500 mLs Intravenous New Bag/Given 06/02/21 1119)  Racepinephrine HCl 2.25 % nebulizer solution 0.5 mL (0.5 mLs Nebulization Given 06/02/21 1132)  diphenhydrAMINE (BENADRYL) injection 50 mg (50 mg Intravenous Given 06/02/21 1141)  iohexol (OMNIPAQUE) 300 MG/ML solution 60 mL (60 mLs Intravenous Contrast Given 06/02/21 1208)    ED Course/ Medical Decision Making/ A&P                           Medical Decision Making Amount and/or Complexity of Data Reviewed External Data Reviewed: labs, radiology and notes. Labs: ordered. Decision-making details documented in ED Course. Radiology: ordered and independent interpretation performed. Decision-making details documented in ED Course.  Risk Drug therapy requiring intensive monitoring for toxicity. Decision regarding hospitalization.  Critical Care Total time providing critical care: 30-74 minutes  Patient is a 51 year old female who  presents with wheezing and shortness of breath.  She initially received albuterol nebulizer treatments with some improvement in symptoms but not significantly.  She was also given IM Decadron.  She had a chest x-ray which showed no acute abnormality.  This was reviewed by me.  No evidence of pneumonia.  No chest pain or symptoms that sound more like ACS.  D-dimer was checked and was positive.  She had a CTA of her chest which showed no evidence of PE or other acute findings other than reactive airway disease.  On reexam, she had more wheezing in her upper chest and throat area.  She said she felt like she had some congestion or something stuck in her throat.  She said that it been going on for a while but seem to have gotten worse.  It did not seem to be related to any kind of allergy to the IV contrast.  However given the symptoms, the radiologist recommended that she get 50 mg of Benadryl prior to the follow-up CT scan.  Given this, she had a CT scan of her neck which showed no mass or airway constriction.  She did not have any actual stridor but did have what seemed to be more upper airway wheezing.  She was given an epi neb.  She had almost complete resolution of symptoms following this.  She felt much better.  She has no hypoxia.  No ongoing shortness of breath.  She was discharged home in good condition.  She has an appointment to follow-up with her primary care doctor on Thursday.  She was given strict return precautions.  Final Clinical Impression(s) / ED Diagnoses Final diagnoses:  Bronchitis  Moderate persistent reactive airway disease with acute exacerbation    Rx / DC Orders ED Discharge Orders     None         Malvin Johns, MD 06/02/21 1311

## 2021-06-02 NOTE — ED Notes (Signed)
Patient transported to CT 

## 2021-06-07 DIAGNOSIS — R053 Chronic cough: Secondary | ICD-10-CM | POA: Diagnosis not present

## 2021-06-07 DIAGNOSIS — R0602 Shortness of breath: Secondary | ICD-10-CM | POA: Diagnosis not present

## 2021-07-24 ENCOUNTER — Other Ambulatory Visit: Payer: Self-pay

## 2021-07-24 ENCOUNTER — Encounter: Payer: Self-pay | Admitting: Internal Medicine

## 2021-07-24 ENCOUNTER — Ambulatory Visit: Payer: BC Managed Care – PPO | Admitting: Internal Medicine

## 2021-07-24 VITALS — BP 142/88 | HR 78 | Ht 65.0 in | Wt 243.6 lb

## 2021-07-24 DIAGNOSIS — J4520 Mild intermittent asthma, uncomplicated: Secondary | ICD-10-CM

## 2021-07-24 DIAGNOSIS — R1319 Other dysphagia: Secondary | ICD-10-CM

## 2021-07-24 DIAGNOSIS — R053 Chronic cough: Secondary | ICD-10-CM | POA: Diagnosis not present

## 2021-07-24 LAB — POCT EXHALED NITRIC OXIDE: FeNO level (ppb): 65

## 2021-07-24 MED ORDER — MONTELUKAST SODIUM 10 MG PO TABS: 10.0000 mg | ORAL_TABLET | Freq: Every day | ORAL | 5 refills | Status: AC

## 2021-07-24 NOTE — Progress Notes (Signed)
? ?      Kathryn Petersen    [id number]    Jul 23, 1970 ? ?Primary Care Physician:[facility] ? ?Referring Physician: [facility] ?[address] ?[address] ?[place],  [place] [address] ?Reason for Consultation: chronic cough ?Date of Consultation: 07/24/2021 ? ?Chief complaint:   ?Chief Complaint  ?Patient presents with  ? Consult  ?  Chronic productive cough  ?  ? ?HPI: ?Kathryn Petersen is a 51 y.o. woman who presents for new patient evaluation of chronic cough. Symptoms have been persistent for many years.  ? ?She coughs every day and is worse at night rather than during the day.  ?She says the cough wakes her up at night. Cough is worse also with eating.  ?She does have reflux. ?She has a sensation of something stuck in her throat and has trouble coughing it up.  ? ?She also says she has itchy throat.  ? ?She denies sinus pain/pressure, runny nose,  itchy watery eyes.  ?She did have two emergency room visits for bronchitis and was given an inhaler which does help those symptoms. She takes inhaler less than once/week.  ? ?She does have dysphagia and feels like food is getting stuck in her throat.  ? ?I have reviewed old records - she has seen ENT in 2016 and was empircally treated for reflux without improvement. [facility] who felt symptoms were related to rhinitis.  ? ? ?Social history: ? ?Exposures: She is originally from [place], has been here since 2004. She did have outdoor biomass fuel exposure when living in [place] ?Smoking history: never smoker, some childhood smoke exposure.  ? ?Social History  ? ?Occupational History  ? Not on file  ?Tobacco Use  ? Smoking status: Never  ? Smokeless tobacco: Never  ?Vaping Use  ? Vaping Use: Never used  ?Substance and Sexual Activity  ? Alcohol use: Yes  ?  Comment: social  ? Drug use: No  ? Sexual activity: Not on file  ? ? ?Relevant family history: ? ?Family History  ?Problem Relation Age of Onset  ? Asthma Neg Hx   ? ? ?Past  Medical History:  ?Diagnosis Date  ? Chronic cough   ? Diabetes mellitus without complication (HCC)   ? GERD (gastroesophageal reflux disease)   ? Globus sensation   ? Iron deficiency anemia   ? ? ?Past Surgical History:  ?Procedure Laterality Date  ? HERNIA REPAIR    ? ? ? ?Physical Exam: ?Blood pressure (!) 142/88, pulse 78, height 5\' 5"  (1.651 m), weight 243 lb 9.6 oz (110.5 kg), SpO2 99 %. ?Gen:      No acute distress ?ENT:  mallampati IV, inflammed nasal turbinades, no nasal polyps, mucus membranes moist ?Lungs:    No increased respiratory effort, symmetric chest wall excursion, clear to auscultation bilaterally, no wheezes or crackles ?CV:         Regular rate and rhythm; no murmurs, rubs, or gallops.  No pedal edema ?Abd:      + bowel sounds; soft, non-tender; no distension ?MSK: no acute synovitis of DIP or PIP joints, no mechanics hands.  ?Skin:      Warm and dry; no rashes ?Neuro: normal speech, no focal facial asymmetry ?Psych: alert and oriented x3, normal mood and affect ? ? ?Data Reviewed/Medical Decision Making: ? ?Independent interpretation of tests: ?Imaging: ? Review of patient's chest xray images revealed no acute cardiopulmonary process. The patient's images have been independently reviewed by  me.  CTPE study Jan 2023 - bronchial wall thickening ? ?PFTs: ?I have personally reviewed the patient's PFTs and pfts show mild airflow limitation with an FEV1 73% of predicted.  ?No flowsheet data found. ? ?Labs: CB shows peripheral eosinophilia ? ?Immunization status:  ?Immunization History  ?Administered Date(s) Administered  ? Influenza,inj,Quad PF,6+ Mos 03/15/2021  ? ? ? I reviewed prior external note(s) from PCP, ED, ENT, Pulmonary at [facility] ? I reviewed the result(s) of the labs and imaging as noted above.  ? I have ordered spiro and feno ? ?Assessment:  ?Chronic Cough ?Mild intermittent asthma ?Dysphagia ? ? ?Plan/Recommendations: ?Differential for chronic cough includes rhinitis, post nasal drainage,  asthma, gerd.  ?She has had a trial of PPI in the past without effect ?She is at risk for developing asthma given prior biomass fuel exposure. Her spirometry shows mild airflow limitation and FeNO is elevated at 65 PPB. I think asthma is a strong consideration.  ?Will add montelukast for allergic rhinitis.  ?Continue prn albuterol. Use is minimal. If symptoms persistent can consider empiric ICS.  ?Will refer to GI for dysphagia.  ? ? ?We discussed disease management and progression at length today.  ? ? ?Return to Care: ?Return in about 2 months (around 09/23/2021). ? ?Durel Salts, MD ?Pulmonary and Critical Care Medicine ?[facility] ?Office:[phone] ? ?CC: [facility]* ? ? ? ?

## 2021-07-24 NOTE — Patient Instructions (Addendum)
Please schedule follow up scheduled with myself in 2 months.  If my schedule is not open yet, we will contact you with a reminder closer to that time. Please call [phone] if you haven't heard from [place] a month before.   Your breathing testing today showed signs for asthma and allergies.   Start flonase nasal spray. Flonase - 1 spray on each side of your nose twice a day for first week, then 1 spray on each side.   Instructions for use: If you also use a saline nasal spray or rinse, use that first. Position the head with the chin slightly tucked. Use the right hand to spray into the left nostril and the right hand to spray into the left nostril.   Point the bottle away from the septum of your nose (cartilage that divides the two sides of your nose).  Hold the nostril closed on the opposite side from where you will spray Spray once and gently sniff to pull the medicine into the higher parts of your nose.  Don't sniff too hard as the medicine will drain down the back of your throat instead. Repeat with a second spray on the same side if prescribed. Repeat on the other side of your nose.   Start taking singulair once a day - this is a medicine for allergies and asthma that might help your itchy throat and coughing.   Continue the albuterol rescue inhaler as needed.   I am referring you to a gastroenterologist to evaluate the trouble swallowing and food getting stuck in your throat.   By learning about asthma and how it can be controlled, you take an important step toward managing this disease. Work closely with your asthma care team to learn all you can about your asthma, how to avoid triggers, what your medications do, and how to take them correctly. With proper care, you can live free of asthma symptoms and maintain a normal, healthy lifestyle.   What is asthma? Asthma is a chronic disease that affects the airways of the lungs. During normal breathing, the bands of muscle that surround  the airways are relaxed and air moves freely. During an asthma episode or "attack," there are three main changes that stop air from moving easily through the airways: The bands of muscle that surround the airways tighten and make the airways narrow. This tightening is called bronchospasm.  The lining of the airways becomes swollen or inflamed.  The cells that line the airways produce more mucus, which is thicker than normal and clogs the airways.  These three factors - bronchospasm, inflammation, and mucus production - cause symptoms such as difficulty breathing, wheezing, and coughing.  What are the most common symptoms of asthma? Asthma symptoms are not the same for everyone. They can even change from episode to episode in the same person. Also, you may have only one symptom of asthma, such as cough, but another person may have all the symptoms of asthma. It is important to know all the symptoms of asthma and to be aware that your asthma can present in any of these ways at any time. The most common symptoms include: Coughing, especially at night  Shortness of breath  Wheezing  Chest tightness, pain, or pressure   Who is affected by asthma? Asthma affects 22 million Americans; about 6 million of these are children under age 46. People who have a family history of asthma have an increased risk of developing the disease. Asthma is also more  common in people who have allergies or who are exposed to tobacco smoke. However, anyone can develop asthma at any time. Some people may have asthma all of their lives, while others may develop it as adults.  What causes asthma? The airways in a person with asthma are very sensitive and react to many things, or "triggers." Contact with these triggers causes asthma symptoms. One of the most important parts of asthma control is to identify your triggers and then avoid them when possible. The only trigger you do not want to avoid is exercise. Pre-treatment with  medicines before exercise can allow you to stay active yet avoid asthma symptoms. Common asthma triggers include: Infections (colds, viruses, flu, sinus infections)  Exercise  Weather (changes in temperature and/or humidity, cold air)  Tobacco smoke  Allergens (dust mites, pollens, pets, mold spores, cockroaches, and sometimes foods)  Irritants (strong odors from cleaning products, perfume, wood smoke, air pollution)  Strong emotions such as crying or laughing hard  Some medications   How is asthma diagnosed? To diagnose asthma, your doctor will first review your medical history, family history, and symptoms. Your doctor will want to know any past history of breathing problems you may have had, as well as a family history of asthma, allergies, eczema (a bumpy, itchy skin rash caused by allergies), or other lung disease. It is important that you describe your symptoms in detail (cough, wheeze, shortness of breath, chest tightness), including when and how often they occur. The doctor will perform a physical examination and listen to your heart and lungs. He or she may also order breathing tests, allergy tests, blood tests, and chest and sinus X-rays. The tests will find out if you do have asthma and if there are any other conditions that are contributing factors.  How is asthma treated? Asthma can be controlled, but not cured. It is not normal to have frequent symptoms, trouble sleeping, or trouble completing tasks. Appropriate asthma care will prevent symptoms and visits to the emergency room and hospital. Asthma medicines are one of the mainstays of asthma treatment. The drugs used to treat asthma are explained below.  Anti-inflammatories: These are the most important drugs for most people with asthma. Anti-inflammatory drugs reduce swelling and mucus production in the airways. As a result, airways are less sensitive and less likely to react to triggers. These medications need to be taken daily and  may need to be taken for several weeks before they begin to control asthma. Anti-inflammatory medicines lead to fewer symptoms, better airflow, less sensitive airways, less airway damage, and fewer asthma attacks. If taken every day, they CONTROL or prevent asthma symptoms.   Bronchodilators: These drugs relax the muscle bands that tighten around the airways. This action opens the airways, letting more air in and out of the lungs and improving breathing. Bronchodilators also help clear mucus from the lungs. As the airways open, the mucus moves more freely and can be coughed out more easily. In short-acting forms, bronchodilators RELIEVE or stop asthma symptoms by quickly opening the airways and are very helpful during an asthma episode. In long-acting forms, bronchodilators provide CONTROL of asthma symptoms and prevent asthma episodes.  Asthma drugs can be taken in a variety of ways. Inhaling the medications by using a metered dose inhaler, dry powder inhaler, or nebulizer is one way of taking asthma medicines. Oral medicines (pills or liquids you swallow) may also be prescribed.  Asthma severity Asthma is classified as either "intermittent" (comes and goes) or "  persistent" (lasting). Persistent asthma is further described as being mild, moderate, or severe. The severity of asthma is based on how often you have symptoms both during the day and night, as well as by the results of lung function tests and by how well you can perform activities. The "severity" of asthma refers to how "intense" or "strong" your asthma is.  Asthma control Asthma control is the goal of asthma treatment. Regardless of your asthma severity, it may or may not be controlled. Asthma control means: You are able to do everything you want to do at work and home  You have no (or minimal) asthma symptoms  You do not wake up from your sleep or earlier than usual in the morning due to asthma  You rarely need to use your reliever medicine  (inhaler)  Another major part of your treatment is that you are happy with your asthma care and believe your asthma is controlled.  Monitoring symptoms A key part of treatment is keeping track of how well your lungs are working. Monitoring your symptoms  what they are, how and when they happen, and how severe they are  is an important part of being able to control your asthma.  Sometimes asthma is monitored using a peak flow meter. A peak flow (PF) meter measures how fast the air comes out of your lungs. It can help you know when your asthma is getting worse, sometimes even before you have symptoms. By taking daily peak flow readings, you can learn when to adjust medications to keep asthma under good control. It is also used to create your asthma action plan (see below). Your doctor can use your peak flow readings to adjust your treatment plan in some cases.  Asthma Action Plan Based on your history and asthma severity, you and your doctor will develop a care plan called an asthma action plan. The asthma action plan describes when and how to use your medicines, actions to take when asthma worsens, and when to seek emergency care. Make sure you understand this plan. If you do not, ask your asthma care provider any questions you may have. Your asthma action plan is one of the keys to controlling asthma. Keep it readily available to remind you of what you need to do every day to control asthma and what you need to do when symptoms occur.  Goals of asthma therapy These are the goals of asthma treatment: Live an active, normal life  Prevent chronic and troublesome symptoms  Attend work or school every day  Perform daily activities without difficulty  Stop urgent visits to the doctor, emergency department, or hospital  Use and adjust medications to control asthma with few or no side effects

## 2021-09-28 ENCOUNTER — Ambulatory Visit: Payer: BC Managed Care – PPO | Admitting: Internal Medicine

## 2021-11-12 ENCOUNTER — Encounter: Payer: Self-pay | Admitting: Internal Medicine

## 2022-06-06 DIAGNOSIS — K219 Gastro-esophageal reflux disease without esophagitis: Secondary | ICD-10-CM | POA: Diagnosis not present

## 2022-06-06 DIAGNOSIS — D509 Iron deficiency anemia, unspecified: Secondary | ICD-10-CM | POA: Diagnosis not present

## 2022-06-06 DIAGNOSIS — Z0000 Encounter for general adult medical examination without abnormal findings: Secondary | ICD-10-CM | POA: Diagnosis not present

## 2022-06-06 DIAGNOSIS — R09A2 Foreign body sensation, throat: Secondary | ICD-10-CM | POA: Diagnosis not present

## 2022-06-06 DIAGNOSIS — R1312 Dysphagia, oropharyngeal phase: Secondary | ICD-10-CM | POA: Diagnosis not present

## 2022-06-06 DIAGNOSIS — Z23 Encounter for immunization: Secondary | ICD-10-CM | POA: Diagnosis not present

## 2022-06-06 DIAGNOSIS — J309 Allergic rhinitis, unspecified: Secondary | ICD-10-CM | POA: Diagnosis not present

## 2022-06-06 DIAGNOSIS — Z131 Encounter for screening for diabetes mellitus: Secondary | ICD-10-CM | POA: Diagnosis not present

## 2022-07-03 DIAGNOSIS — Z1231 Encounter for screening mammogram for malignant neoplasm of breast: Secondary | ICD-10-CM | POA: Diagnosis not present

## 2022-07-09 DIAGNOSIS — R09A2 Foreign body sensation, throat: Secondary | ICD-10-CM | POA: Diagnosis not present

## 2022-07-09 DIAGNOSIS — J309 Allergic rhinitis, unspecified: Secondary | ICD-10-CM | POA: Diagnosis not present

## 2022-07-09 DIAGNOSIS — K219 Gastro-esophageal reflux disease without esophagitis: Secondary | ICD-10-CM | POA: Diagnosis not present

## 2022-10-04 IMAGING — CT CT ANGIO CHEST
2 of 8 series · 18 of 36 positions shown · IV contrast (Omnipaque)
Comparison: Contrast enhanced chest CT 08/10/2019.

CLINICAL DATA: 51-year-old female with positive D-dimer. Clinical
suspicion for pulmonary embolism.

EXAM:
CT ANGIOGRAPHY CHEST WITH CONTRAST
TECHNIQUE: Multidetector CT imaging of the chest was performed using the
standard protocol during bolus administration of intravenous
contrast. Multiplanar CT image reconstructions and MIPs were
obtained to evaluate the vascular anatomy.

[Series 6: pe coronal mpr · coronal · 0.58mm/px · 1 of 136 slices shown]
[im 68/136  mediastinal]
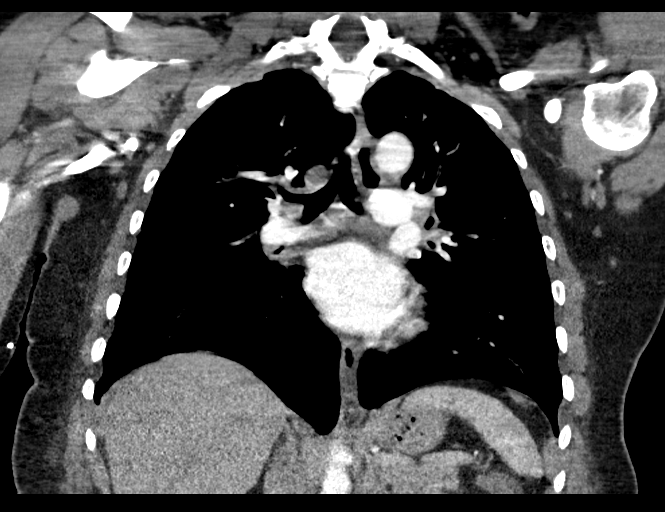

[Series 10: pe thins · axial · 0.74mm/px · z∈[-100,+148]mm · 17 of 278 slices shown]
[im 15/278  lung]
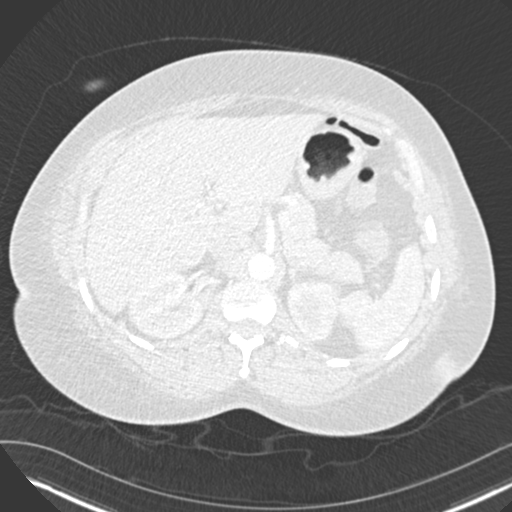
[im 30/278  mediastinal]
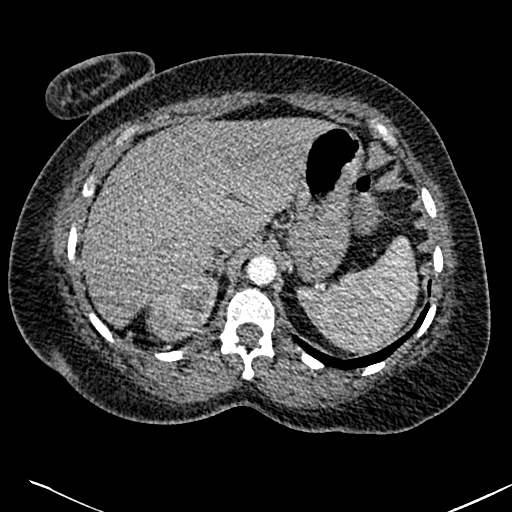
[im 44/278  lung]
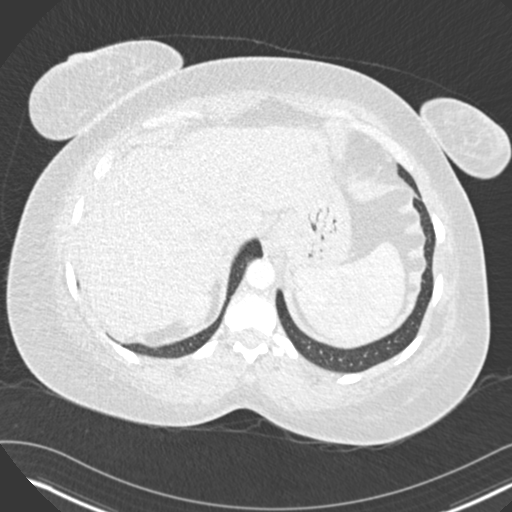
[im 59/278  mediastinal]
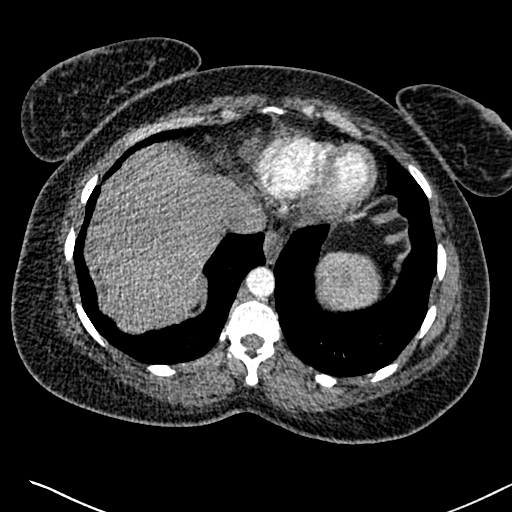
[im 73/278  lung]
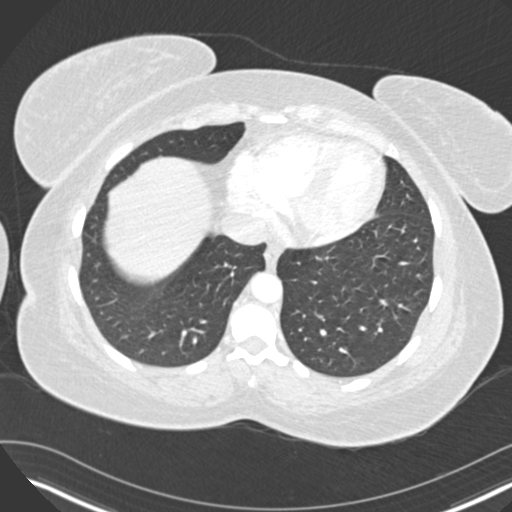
[im 88/278  mediastinal]
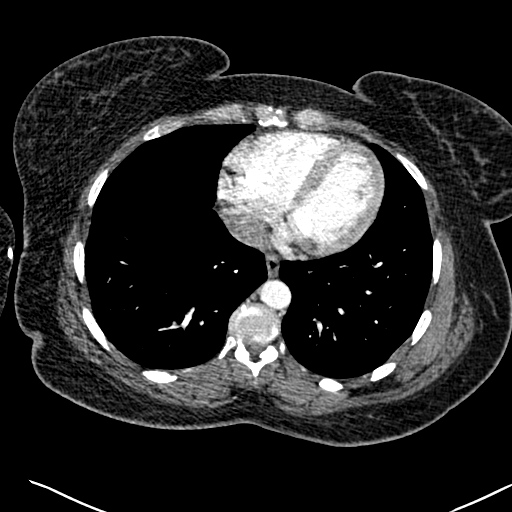
[im 103/278  lung]
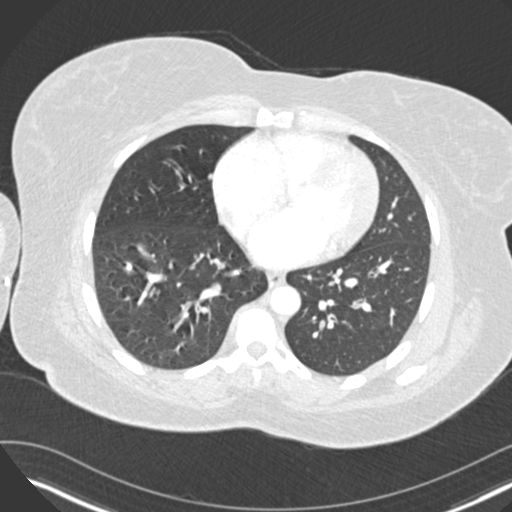
[im 117/278  mediastinal]
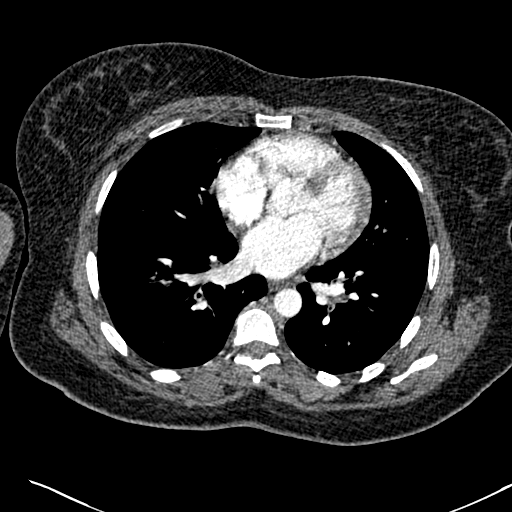
[im 146/278  lung]
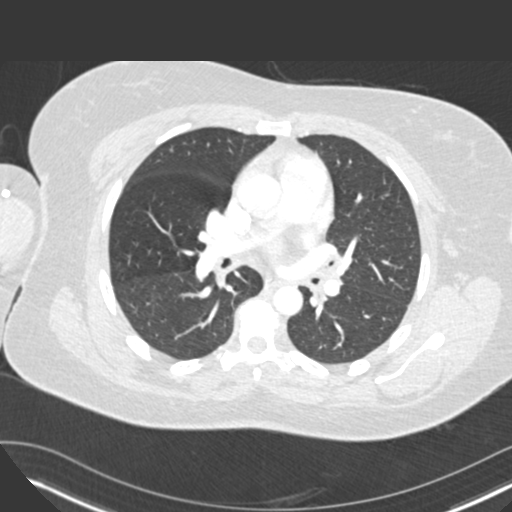
[im 161/278  mediastinal]
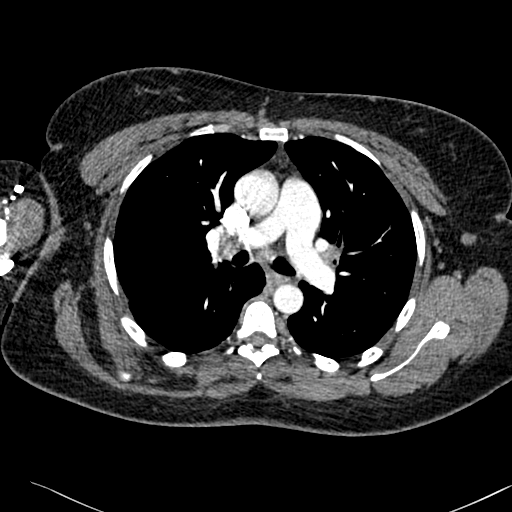
[im 175/278  lung]
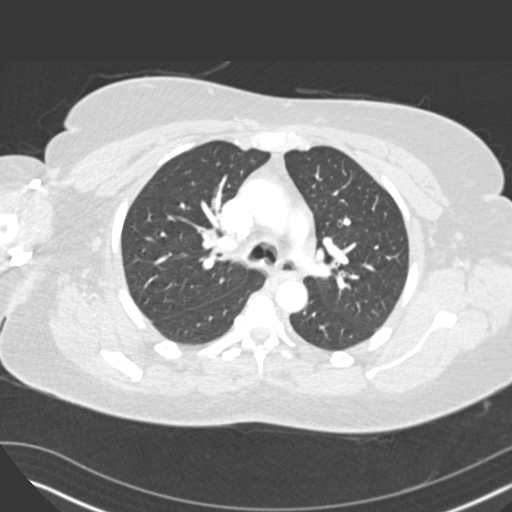
[im 190/278  mediastinal]
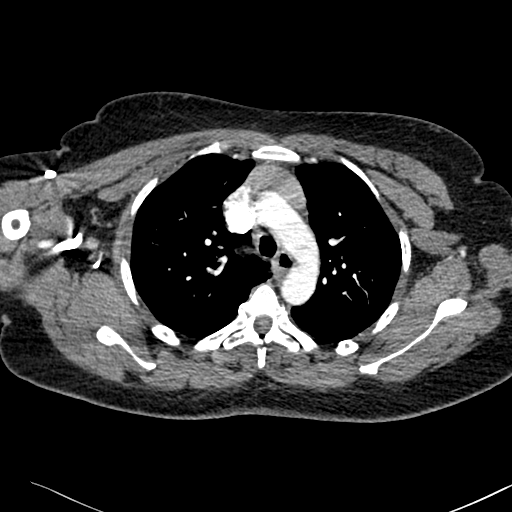
[im 205/278  lung]
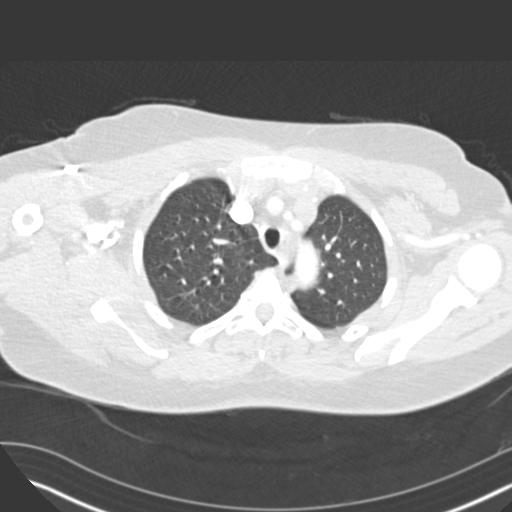
[im 219/278  mediastinal]
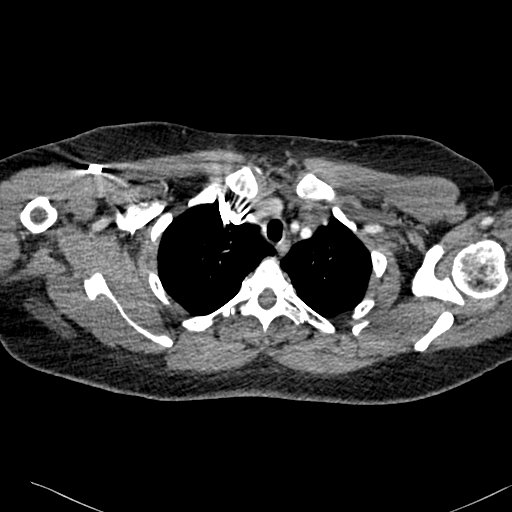
[im 234/278  lung]
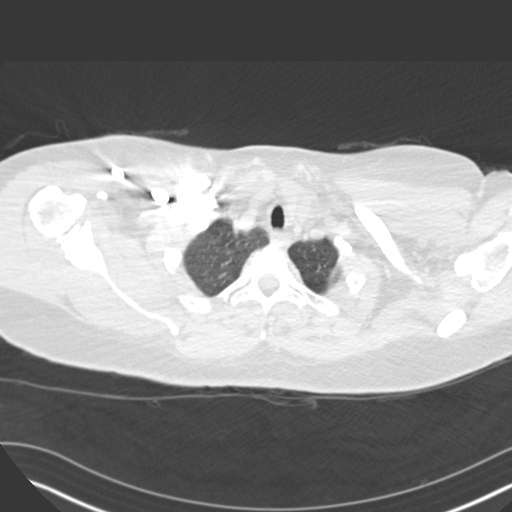
[im 248/278  mediastinal]
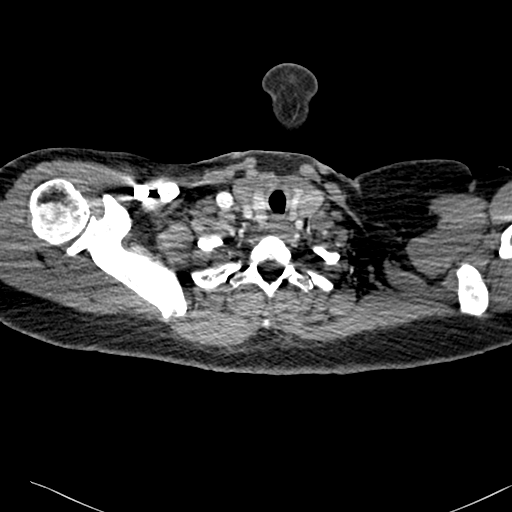
[im 263/278  lung]
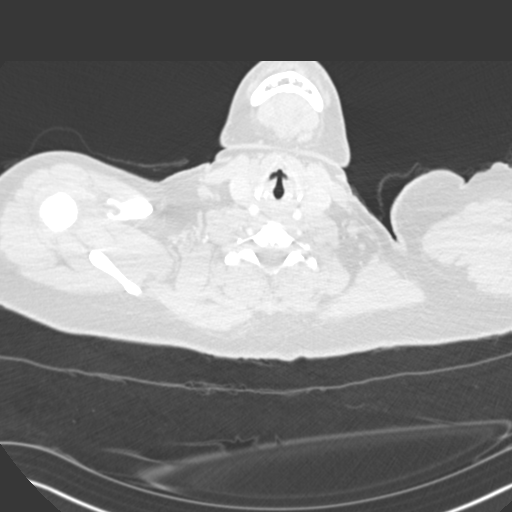

[18 of 36 positions shown; findings below may reference images not displayed]

RADIATION DOSE REDUCTION: This exam was performed according to the
departmental dose-optimization program which includes automated
exposure control, adjustment of the mA and/or kV according to
patient size and/or use of iterative reconstruction technique.

CONTRAST:  100mL OMNIPAQUE IOHEXOL 350 MG/ML SOLN
FINDINGS: Cardiovascular: There are no filling defects within the pulmonary
arterial tree to suggest pulmonary embolism. Heart size is normal.
There is no significant pericardial fluid, thickening or pericardial
calcification. No atherosclerotic calcifications are noted in the
thoracic aorta or the coronary arteries.

Mediastinum/Nodes: No pathologically enlarged mediastinal or hilar
lymph nodes. Esophagus is unremarkable in appearance. No axillary
lymphadenopathy.

Lungs/Pleura: No acute consolidative airspace disease. No pleural
effusions. No suspicious appearing pulmonary nodules or masses are
noted. Diffuse bronchial wall thickening is identified. No
peribronchovascular inflammatory changes.

Upper Abdomen: Unremarkable.

Musculoskeletal: There are no aggressive appearing lytic or blastic
lesions noted in the visualized portions of the skeleton.

Review of the MIP images confirms the above findings.
IMPRESSION: 1. No evidence of pulmonary embolism.
2. Diffuse bronchial wall thickening, suggestive of severe reactive
airway disease.

## 2022-10-04 IMAGING — US US EXTREM LOW VENOUS*L*
1 series · 14 of 24 positions shown · non-contrast
Comparison: Chest XR, concurrent.

CLINICAL DATA: Leg swelling

EXAM:
LEFT LOWER EXTREMITY VENOUS DOPPLER ULTRASOUND
TECHNIQUE: Gray-scale sonography with compression, as well as color and duplex
ultrasound, were performed to evaluate the deep venous system(s)
from the level of the common femoral vein through the popliteal and
proximal calf veins.

[Series 1: us extrem low venous*left* · 14 of 41 slices shown]
[im 1/41]
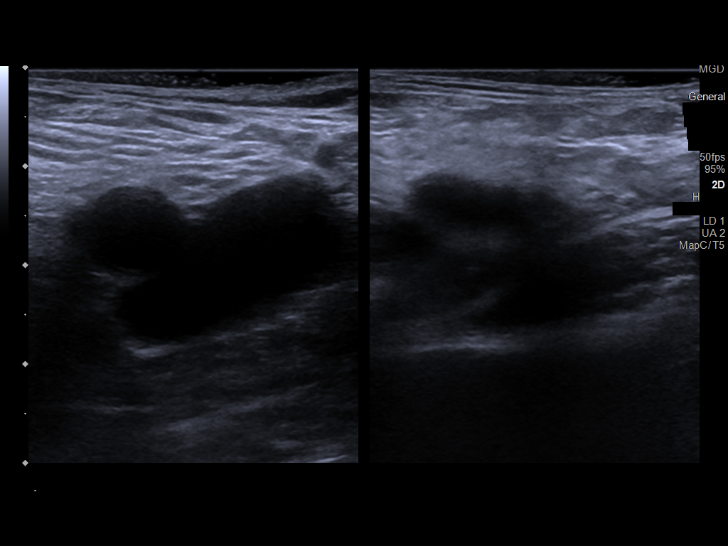
[im 4/41]
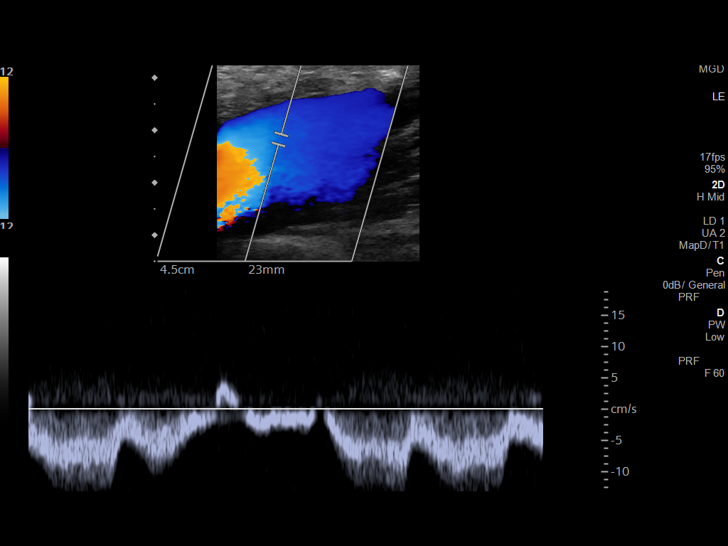
[im 7/41]
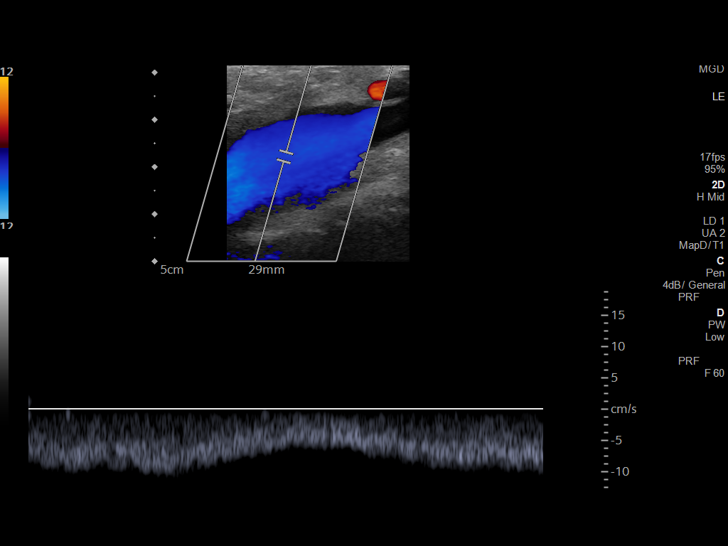
[im 11/41]
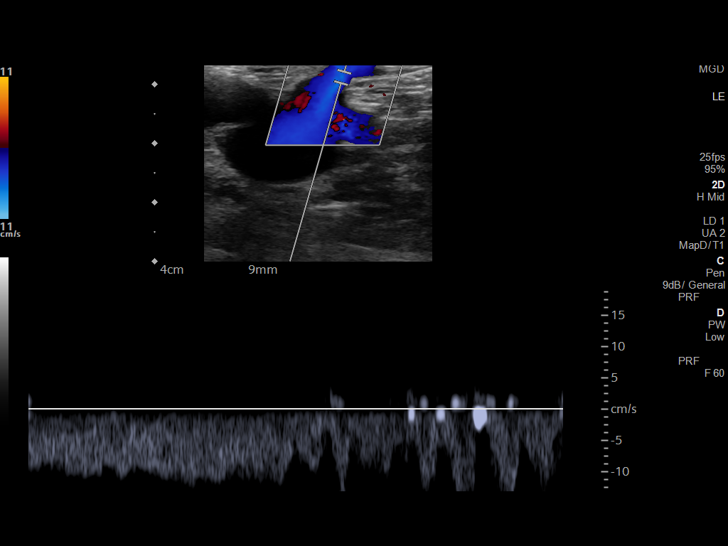
[im 13/41]
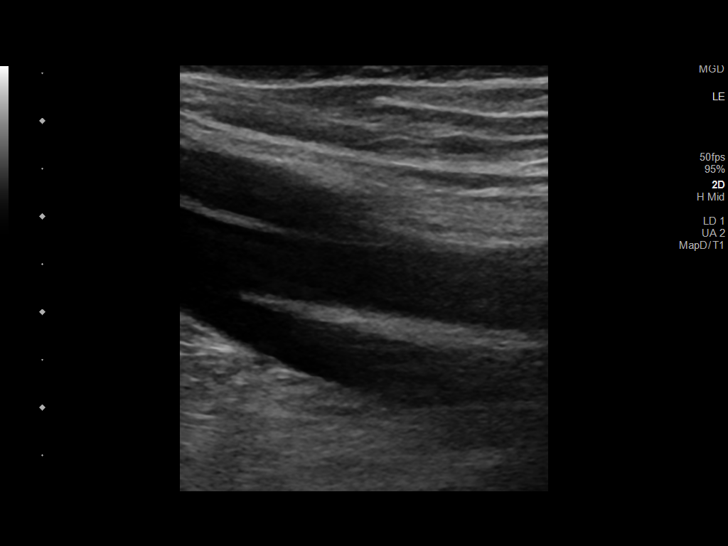
[im 16/41]
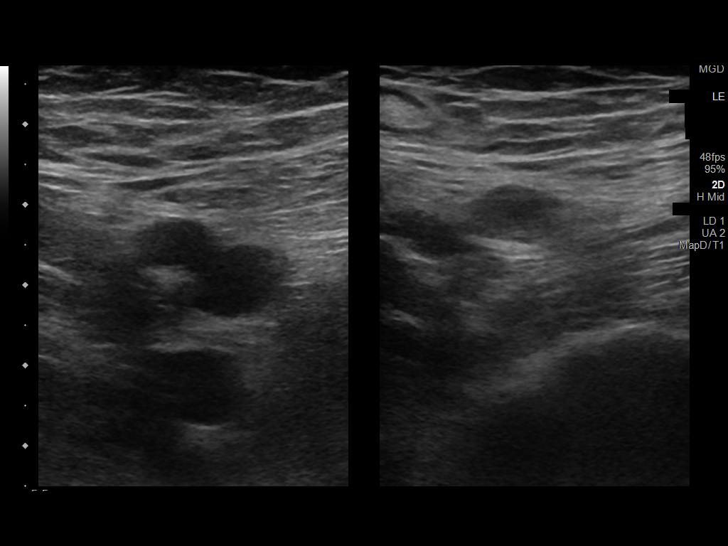
[im 20/41]
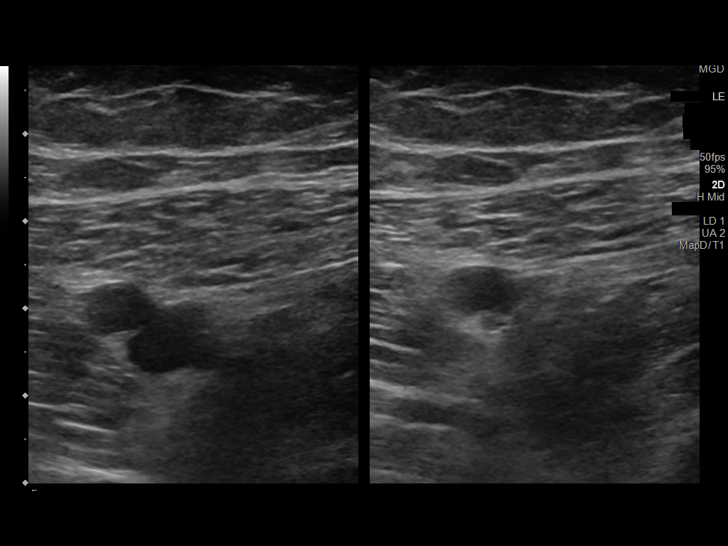
[im 21/41]
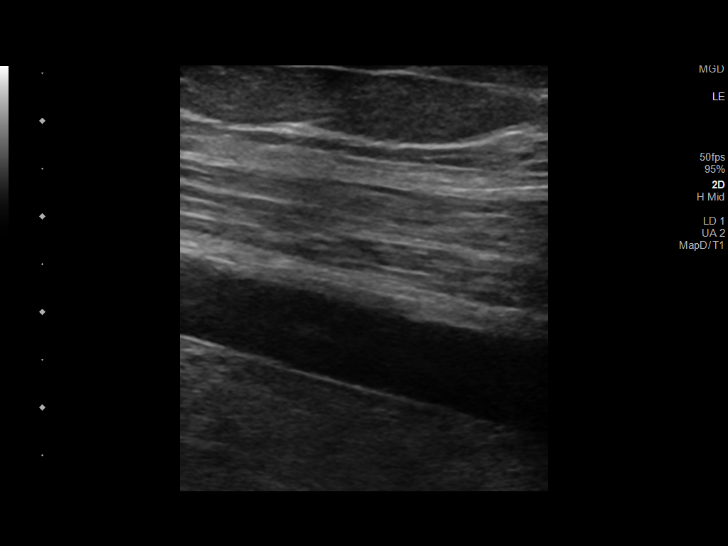
[im 25/41]
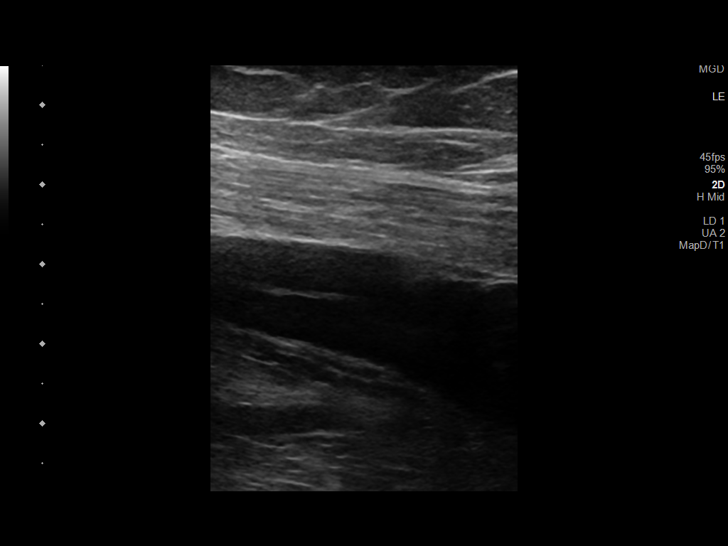
[im 28/41]
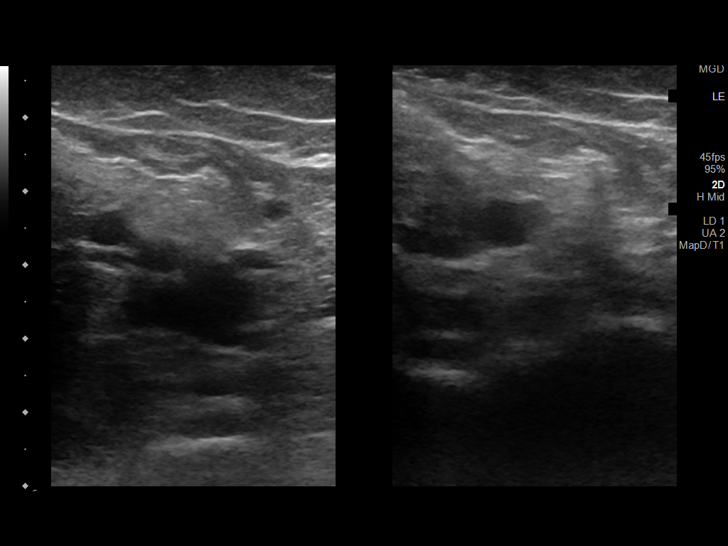
[im 32/41]
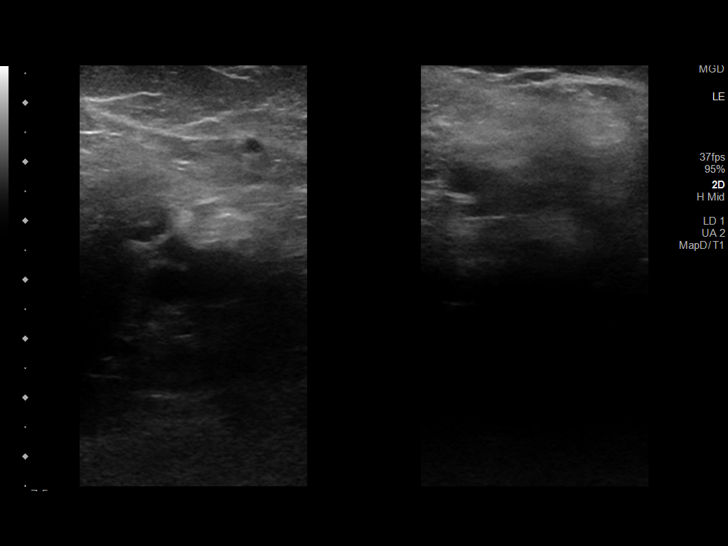
[im 34/41]
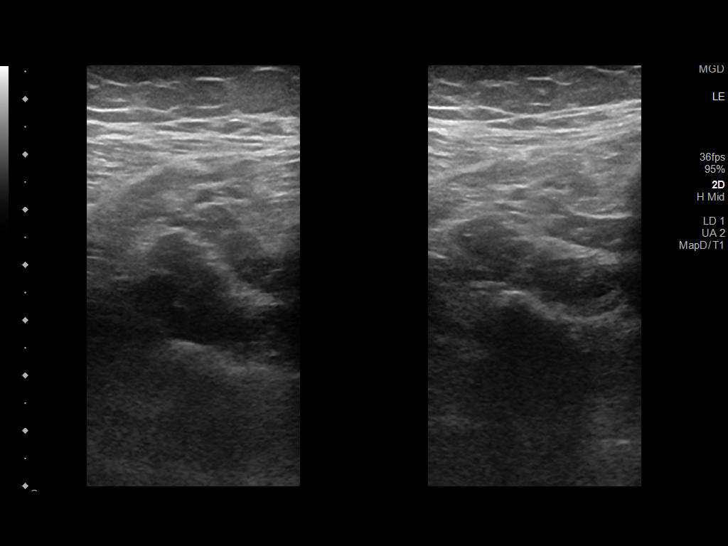
[im 37/41]
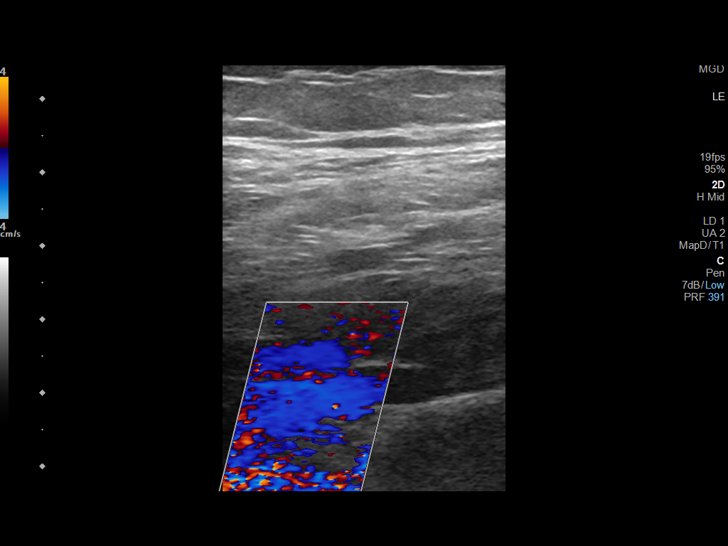
[im 41/41]
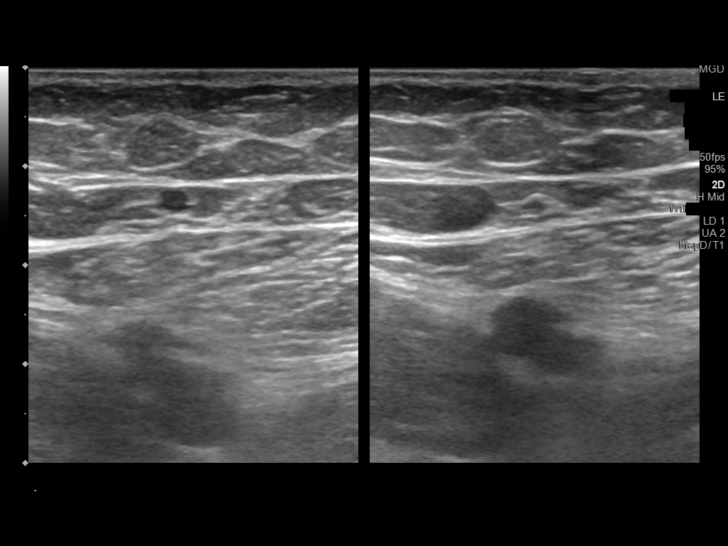

[14 of 24 positions shown; findings below may reference images not displayed]

FINDINGS: VENOUS

Normal compressibility of the LEFT common femoral, superficial
femoral, and popliteal veins, as well as the visualized calf veins.
Visualized portions of profunda femoral vein and great saphenous
vein unremarkable. No filling defects to suggest DVT on grayscale or
color Doppler imaging. Doppler waveforms show normal direction of
venous flow, normal respiratory plasticity and response to
augmentation.

Limited views of the contralateral common femoral vein are
unremarkable.

OTHER

No evidence of superficial thrombophlebitis or abnormal fluid
collection.

Limitations: none
IMPRESSION: No evidence of femoropopliteal DVT within the LEFT lower extremity.

## 2023-05-09 DIAGNOSIS — G5601 Carpal tunnel syndrome, right upper limb: Secondary | ICD-10-CM | POA: Diagnosis not present

## 2023-05-09 DIAGNOSIS — R030 Elevated blood-pressure reading, without diagnosis of hypertension: Secondary | ICD-10-CM | POA: Diagnosis not present
